# Patient Record
Sex: Male | Born: 1950 | Race: Black or African American | Hispanic: No | Marital: Married | Smoking: Never smoker
Health system: Southern US, Community
[De-identification: ages and names within clinical notes are randomized; demographics above are authoritative.]

## PROBLEM LIST (undated history)

## (undated) DIAGNOSIS — I10 Essential (primary) hypertension: Secondary | ICD-10-CM

## (undated) DIAGNOSIS — E079 Disorder of thyroid, unspecified: Secondary | ICD-10-CM

## (undated) HISTORY — DX: Essential (primary) hypertension: I10

## (undated) HISTORY — DX: Disorder of thyroid, unspecified: E07.9

---

## 1999-11-15 ENCOUNTER — Encounter: Payer: Self-pay | Admitting: Urology

## 1999-11-15 ENCOUNTER — Encounter: Admission: RE | Admit: 1999-11-15 | Discharge: 1999-11-15 | Payer: Self-pay | Admitting: Urology

## 1999-11-19 ENCOUNTER — Encounter (INDEPENDENT_AMBULATORY_CARE_PROVIDER_SITE_OTHER): Payer: Self-pay

## 1999-11-19 ENCOUNTER — Encounter: Payer: Self-pay | Admitting: Urology

## 1999-11-19 ENCOUNTER — Ambulatory Visit (HOSPITAL_COMMUNITY): Admission: RE | Admit: 1999-11-19 | Discharge: 1999-11-19 | Payer: Self-pay | Admitting: Urology

## 1999-11-20 ENCOUNTER — Encounter: Payer: Self-pay | Admitting: Urology

## 1999-11-20 ENCOUNTER — Inpatient Hospital Stay (HOSPITAL_COMMUNITY): Admission: EM | Admit: 1999-11-20 | Discharge: 1999-11-21 | Payer: Self-pay | Admitting: Urology

## 1999-11-21 ENCOUNTER — Encounter: Payer: Self-pay | Admitting: Gastroenterology

## 2002-11-19 ENCOUNTER — Encounter (INDEPENDENT_AMBULATORY_CARE_PROVIDER_SITE_OTHER): Payer: Self-pay | Admitting: *Deleted

## 2002-11-19 ENCOUNTER — Ambulatory Visit (HOSPITAL_COMMUNITY): Admission: RE | Admit: 2002-11-19 | Discharge: 2002-11-19 | Payer: Self-pay | Admitting: *Deleted

## 2004-04-05 ENCOUNTER — Encounter: Admission: RE | Admit: 2004-04-05 | Discharge: 2004-06-13 | Payer: Self-pay | Admitting: Occupational Therapy

## 2004-04-23 ENCOUNTER — Emergency Department (HOSPITAL_COMMUNITY): Admission: EM | Admit: 2004-04-23 | Discharge: 2004-04-23 | Payer: Self-pay

## 2005-10-24 ENCOUNTER — Emergency Department (HOSPITAL_COMMUNITY): Admission: EM | Admit: 2005-10-24 | Discharge: 2005-10-24 | Payer: Self-pay | Admitting: Emergency Medicine

## 2005-12-20 ENCOUNTER — Emergency Department (HOSPITAL_COMMUNITY): Admission: EM | Admit: 2005-12-20 | Discharge: 2005-12-20 | Payer: Self-pay | Admitting: Emergency Medicine

## 2006-06-27 ENCOUNTER — Emergency Department (HOSPITAL_COMMUNITY): Admission: EM | Admit: 2006-06-27 | Discharge: 2006-06-27 | Payer: Self-pay | Admitting: Emergency Medicine

## 2007-03-09 ENCOUNTER — Emergency Department (HOSPITAL_COMMUNITY): Admission: EM | Admit: 2007-03-09 | Discharge: 2007-03-09 | Payer: Self-pay | Admitting: Emergency Medicine

## 2007-10-31 ENCOUNTER — Emergency Department (HOSPITAL_COMMUNITY): Admission: EM | Admit: 2007-10-31 | Discharge: 2007-10-31 | Payer: Self-pay | Admitting: Emergency Medicine

## 2007-12-06 ENCOUNTER — Ambulatory Visit: Payer: Self-pay | Admitting: Internal Medicine

## 2007-12-06 ENCOUNTER — Observation Stay (HOSPITAL_COMMUNITY): Admission: EM | Admit: 2007-12-06 | Discharge: 2007-12-07 | Payer: Self-pay | Admitting: Emergency Medicine

## 2009-09-25 IMAGING — CT CT HEAD W/O CM
1 series · 16 of 30 positions shown, 20 images · IV contrast (agent unspecified)
Comparison: none

CLINICAL DATA: Syncopal episode.  
 HEAD CT WITHOUT CONTRAST:
TECHNIQUE: Contiguous axial images were obtained from the base of the skull through the vertex according to standard protocol without contrast.

[Series 2: head routine 4.8 h37s · axial · 0.47mm/px · z∈[-175,-20]mm · 16 of 36 slices shown, 20 images]
[im 2/36  brain]
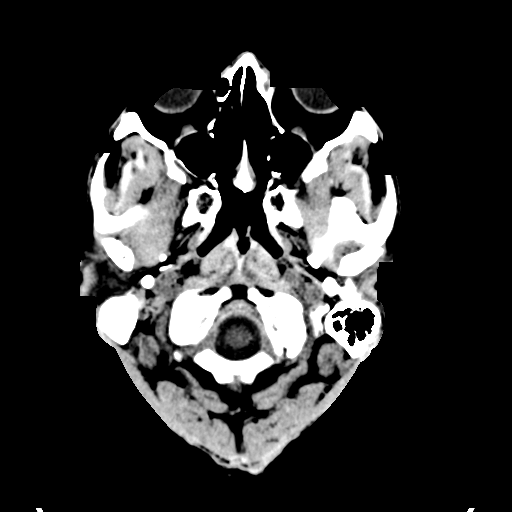
[im 2/36  bone]
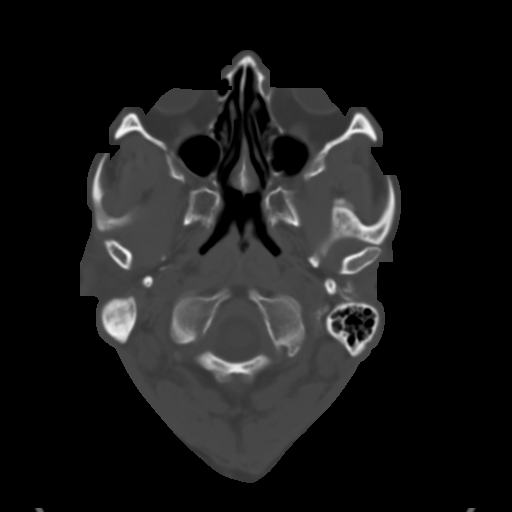
[im 4/36  brain]
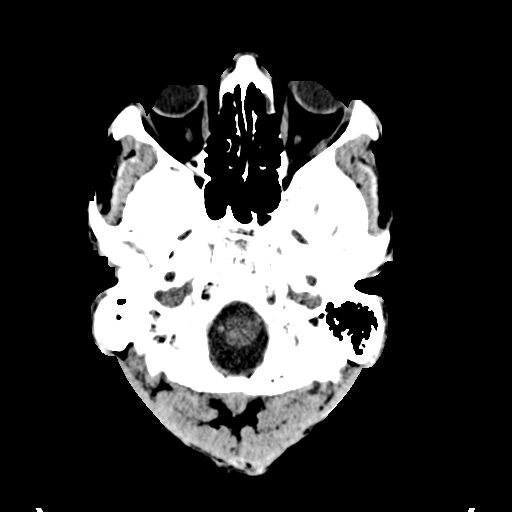
[im 7/36  brain]
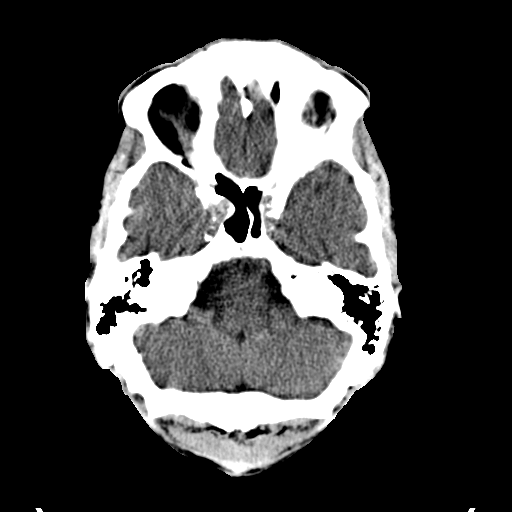
[im 9/36  brain]
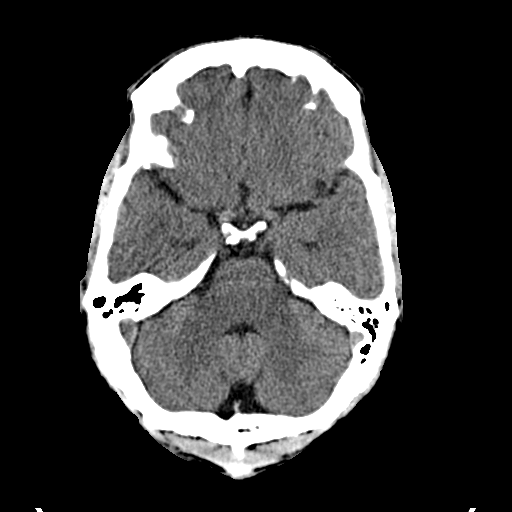
[im 10/36  brain]
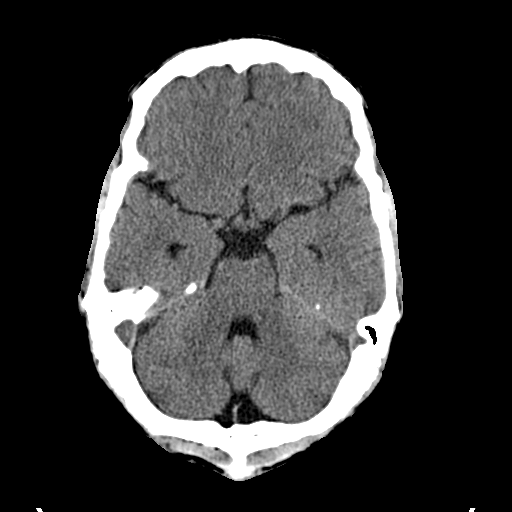
[im 10/36  bone]
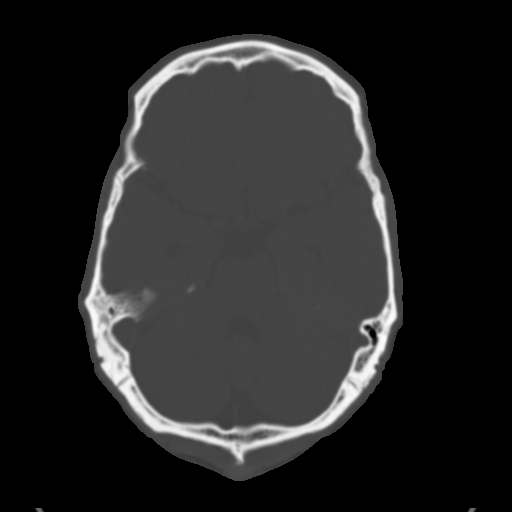
[im 13/36  brain]
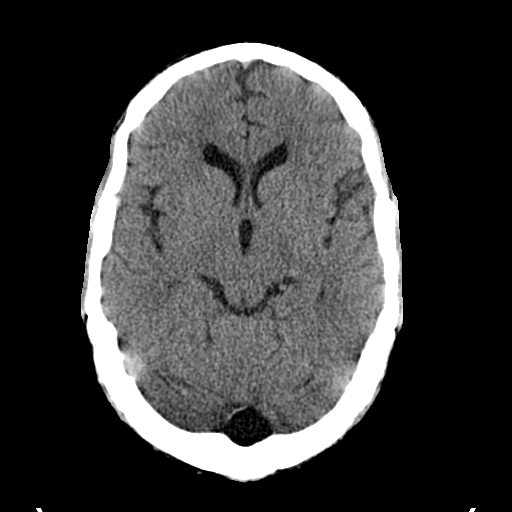
[im 15/36  brain]
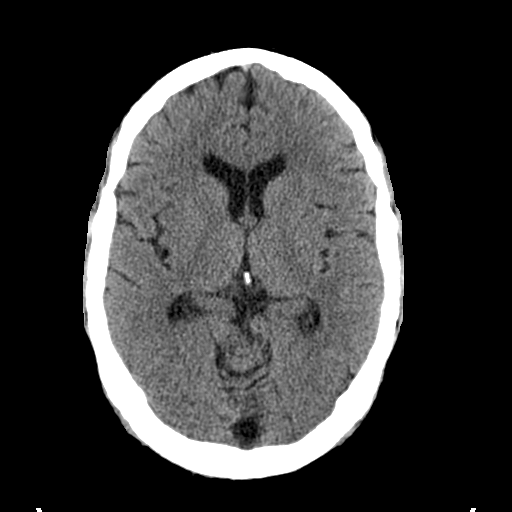
[im 17/36  brain]
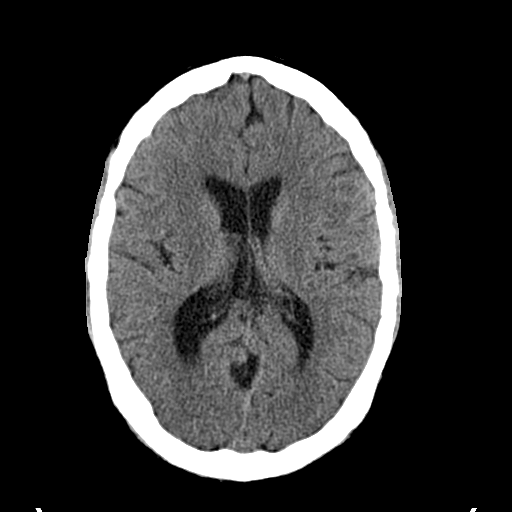
[im 19/36  brain]
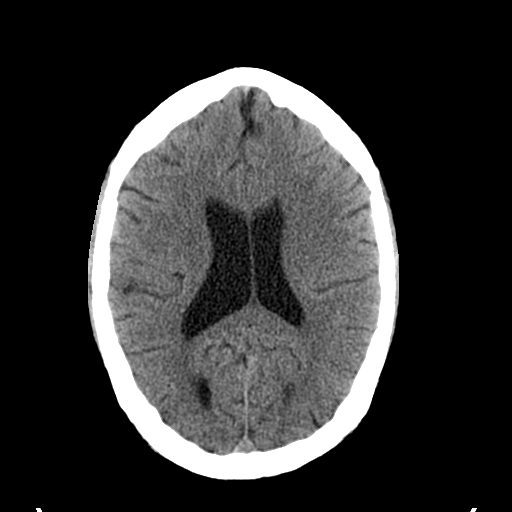
[im 19/36  bone]
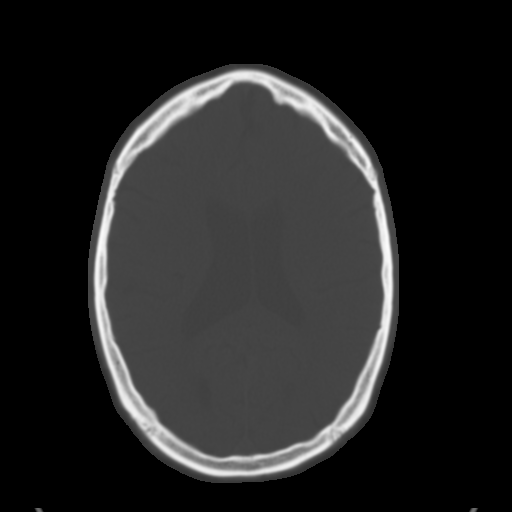
[im 21/36  brain]
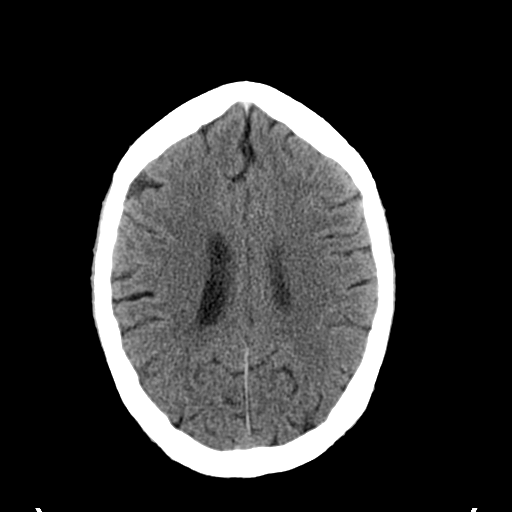
[im 23/36  brain]
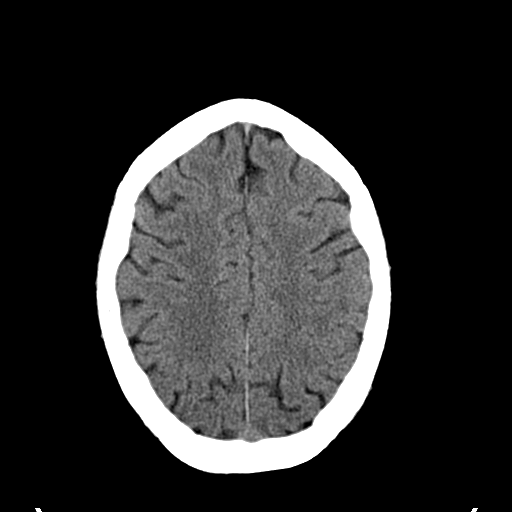
[im 26/36  brain]
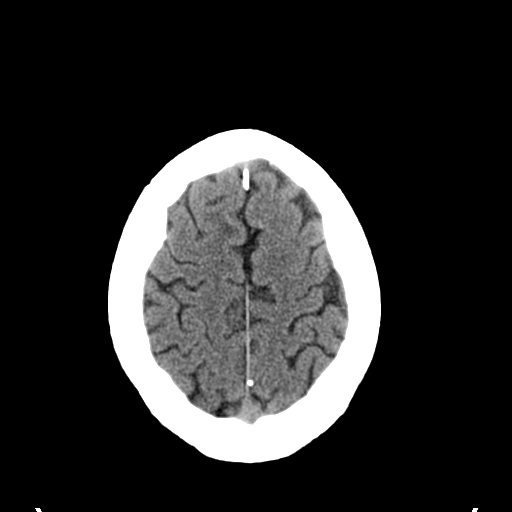
[im 27/36  brain]
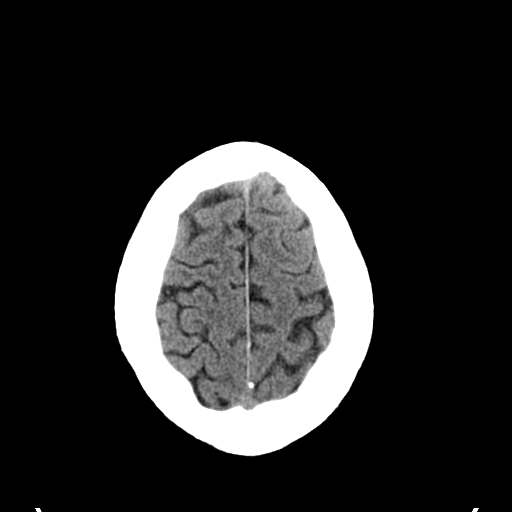
[im 27/36  bone]
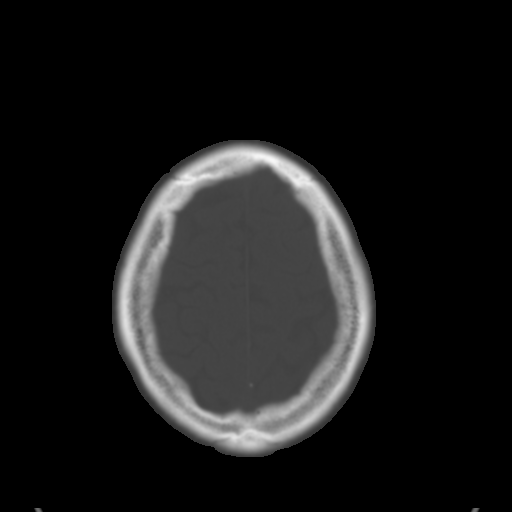
[im 29/36  brain]
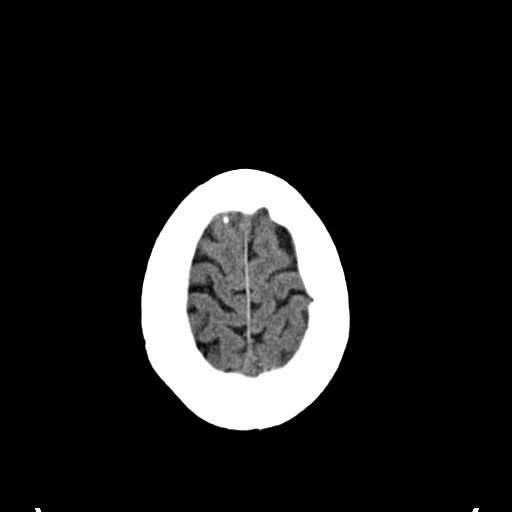
[im 32/36  brain]
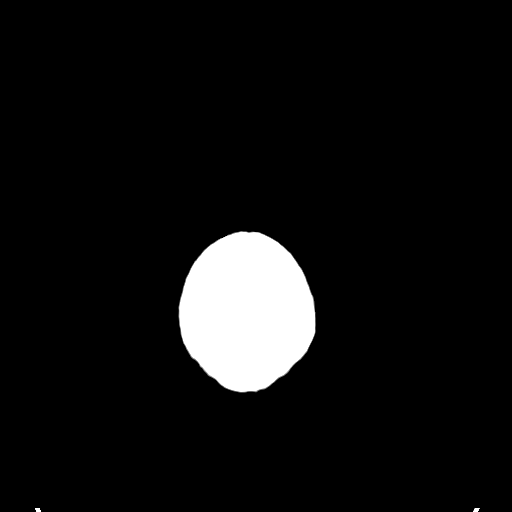
[im 34/36  brain]
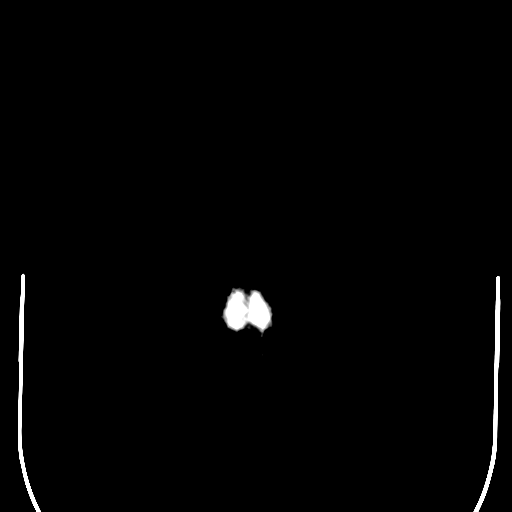

[16 of 30 positions shown; findings below may reference images not displayed]

FINDINGS: There is no evidence of intracranial hemorrhage, brain edema, acute infarct, mass lesion, or mass effect.  No other intra-axial abnormalities are seen, and the ventricles are within normal limits.  No abnormal extra-axial fluid collections or masses are identified.  No skull abnormalities are noted.
IMPRESSION: Negative non-contrast head CT.

## 2011-02-12 NOTE — Discharge Summary (Signed)
Lucas Davidson, HAWE NO.:  [id number]   MEDICAL RECORD NO.:  [id number]          PATIENT TYPE:  OBV   LOCATION:  6738                         FACILITY:  [facility]   PHYSICIAN:  Madaline Guthrie, M.D.    DATE OF BIRTH:  [birth year]   DATE OF ADMISSION:  12/06/2007  DATE OF DISCHARGE:  12/07/2007                               DISCHARGE SUMMARY   DISCHARGE DIAGNOSES:  1. Near syncope.  2. History of liver transplant.  3. End-stage renal disease.  4. Hypertension.  5. Hyperlipidemia.  6. Hypothyroidism.   DISCHARGE MEDICATIONS:  1. Metoprolol 25 mg p.o. b.i.d.  2. Synthroid 50 mcg p.o. daily.  3. Omeprazole.  4. Cellcept 250 mg two tablets p.o. b.i.d.  5. Pravastatin 40 mg p.o. daily.  6. Sirolimus (Rapamune) 1 mg two tablets p.o. daily.  7. Amlodipine 5 mg p.o. daily.  8. Aspirin 81 mg p.o. daily.  9. Fish Oil one capsule p.o. t.i.d.  10.Calcium 500 mg p.o. t.i.d.  11.Ferrous sulfate 325 mg p.o. t.i.d.   DISPOSITION:  The patient is being discharged on December 07, 2007.  The  patient states that he has an appointment this same morning at [facility] at 10 a.m. on the morning of December 07, 2007.  The patient is to follow up with Transplant Center and discuss  near syncopal episode.  The patient is to take discharge summary with  him for transplant center to review workup done in the hospital.   PROCEDURE:  The patient had a noncontrast CT of the head done December 06, 2007, preliminary impression negative, no acute findings.   CONSULTATIONS:  There were no consultations requested during this  hospitalization.   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  He  now presents with a  near syncopal episode the morning of admission while at church.  The  patient states they finished singing while at church in the choir.  The  patient sat down and felt queasy and began belching.  The patient  states belching has been present since liver transplant.  The patient  states that he had some feelings of warmth spread throughout his body  followed by diffuse sweating and pins and needle feeling in the head  area.  The patient had some associated nausea but no vomiting nor loss  of consciousness.  The patient was apparently taken by nurse for  examination.  The patient states that blood pressure was elevated.  EMS  was called at that time and the patient was brought to the emergency  department.  Glucose was not checked by EMS and was checked upon arrival  to the emergency department which was 94.  The patient's symptoms had  resolved by the time he arrived to the emergency department.  The  patient denies any fevers, chills,  weight loss, fatigue, recent travel,  sick contacts, night sweats, chest pain, palpitations, dyspnea,  orthopnea, syncope, dyspnea on exertion, cough, vomiting, diarrhea,  constipation, hematochezia, melena, dysuria, flank pain, numbness,  headache, diplopia, changes in vision, dizziness and balance vertigo, or  neck stiffness.   PHYSICAL EXAMINATION:  VITAL SIGNS:  Temperature 97.1, blood pressure  134/90, pulse 73, respirations 16, oxygen saturations 96% on room air.  GENERAL:  The patient was in no acute distress.  HEENT:  Eyes; no scleral icterus, pupils equal, round, and reactive to  light and accommodation, extraocular movements intact.  ENT pink moist  mucous membranes, oropharynx was clear.  NECK:  Supple with no carotid bruits.  LUNGS:  Respirations were clear to auscultation bilaterally with good  air movement.  CARDIOVASCULAR:  The patient had a regular rate and rhythm without any  murmurs, rubs, or gallops.   GASTROINTESTINAL:  Positive bowel sounds, soft, nontender, and  nondistended.  Scar from prior liver transplant noted with numbness  inferior to the scar noted.  EXTREMITIES:  Trace edema bilaterally.  GENITOURINARY:  No costovertebral angle tenderness.  SKIN:  No rashes noted, scar as per GI examination.  MUSCULOSKELETAL:  Chronic joint pain, but no swelling, erythema, or  tenderness upon palpation.  NEUROLOGY:  The patient was awake, alert and oriented x3.  Cranial  nerves II-XII grossly intact.  Strength intact.  Sensory intact.  No  asterixis.  PSYCHIATRIC:  The patient's affect was appropriate and pleasant.   LABORATORY DATA:  Sodium 142, potassium 4.2, chloride 110, bicarb 25,  BUN 32, creatinine 3.41, noted to be 3.37 in June of 2008, glucose 94,  anion gap 7, bilirubin 0.5, alkaline phosphatase 75, AST 24, ALT 26,  protein 6.6, albumin 3.5, calcium 9.1.  WBC 3.7, ANC 2.8, ALC 0.6,  hemoglobin 11.4 noted to be 11.1 in June of 2008, platelets 160,000.  Point of care markers included myoglobin 361, CK-MB 1.8, troponin I less  than 0.05.  Urinalysis only significant for trace blood and 30 of  protein.  PT was 13.3, INR 1.0, PTT 27.   HOSPITAL COURSE:  Problem 1.  Near syncope.  Differential diagnosis was  extensive but major concerns included liver failure/rejection which was  essentially ruled out with normal limit LFT's and coagulation panel.  Worsening renal failure was also of concern, but the patient's  creatinine was at baseline without any signs of uremia.  Stroke/bleed  was also of some concern but was essentially ruled out with a negative  CT scan of the head.  Anemia was also of concern, but the patient's  hemoglobin was 11.4 with a known history of anemia and prior hemoglobin  of 11.1 noted in June of 2008, the patient is also scheduled for Aranesp  injections every six weeks and is currently due for one at follow-up  appointment at [facility].  Acute coronary syndrome was  also considered, though  the patient does not have any history of coronary artery disease with an  apparent negative catheterization in 2006 and a negative stress test in  June of 2008 per patient.  Also first set of cardiac markers was only  positive for slightly increased myoglobin with a normal limit CK-MB and  troponin along with unchanged electrocardiogram except for flattening of  the P waves in the lateral leads.  The patient was essentially ruled out  for acute coronary syndrome with serial EKG's that were unchanged and  cardiac enzymes that were negative x3.  Arrhythmia was also considered  and the patient was observed on telemetry without any events noted.  Orthostatic hypotension was also considered, but given the patient was  not hypotensive nor orthostatic, blood pressure medications were  continued.  Hypoglycemia was also entertained, but given that CBG was  not measured on the field, it is unclear if that is a possible etiology,  though upon arrival at the emergency department, glucose was 94 and the  patient has no history of diabetes nor is on antihyperglycemics.  The  patient does have a history of hypothyroidism, therefore TSH was checked  given the patient is on Synthroid.  The patient's TSH was within normal  limits at 3.4.   Problem 2.  History of liver transplant.  As per #1 the patient had a  second liver transplant on November 29, 2003, approximately one month after  the first secondary to rejection.  The patient's LFT's and coags were  within normal limits.  The patient is followed up at [facility] and has an  appointment on December 07, 2007.  The patient was continued on Cellcept and  Rapamune during the hospitalization.   Problem 3.  End-stage renal disease.  The patient is followed at [facility] by  Dr. Tammi Klippel.  Creatinine is at baseline of 3.5.  The patient did not  have any signs of uremia during the hospitalization.   Problem 4.  Hypertension.  The patient's blood pressure  was stable and  blood pressure medications were continued.   Problem 5.  Hyperlipidemia.  The patient's Pravastatin was continued.   Problem 6.  History of hypothyroidism.  The patient's Synthroid was  continued and TSH was checked and was within normal limits at 3.4.   DISCHARGE LABORATORY DATA:  PSH 3.4, CK 226, CK-MB 2.0.  Troponin 0.06.  PTT 27, PT 13.3, INR 1.0.      Mariea Stable, MD  Electronically Signed      Madaline Guthrie, M.D.  Electronically Signed    MA/MEDQ  D:  12/06/2007  T:  12/07/2007  Job:  [id number]   cc:   [facility]  Dr. Tammi Klippel, [facility]

## 2011-06-24 LAB — DIFFERENTIAL
Basophils Relative: 0
Eosinophils Absolute: 0
Monocytes Absolute: 0.3
Monocytes Relative: 8

## 2011-06-24 LAB — URINALYSIS, ROUTINE W REFLEX MICROSCOPIC
Bilirubin Urine: NEGATIVE
Ketones, ur: NEGATIVE
Nitrite: NEGATIVE
Specific Gravity, Urine: 1.012
Urobilinogen, UA: 0.2

## 2011-06-24 LAB — CARDIAC PANEL(CRET KIN+CKTOT+MB+TROPI)
CK, MB: 1.5
Relative Index: 0.7
Total CK: 202

## 2011-06-24 LAB — BASIC METABOLIC PANEL
CO2: 25
GFR calc non Af Amer: 19 — ABNORMAL LOW
Glucose, Bld: 106 — ABNORMAL HIGH
Potassium: 3.9
Sodium: 139

## 2011-06-24 LAB — COMPREHENSIVE METABOLIC PANEL
ALT: 26
Alkaline Phosphatase: 75
BUN: 32 — ABNORMAL HIGH
CO2: 25
GFR calc non Af Amer: 19 — ABNORMAL LOW
Glucose, Bld: 94
Potassium: 4.2
Sodium: 142

## 2011-06-24 LAB — CBC
HCT: 34.6 — ABNORMAL LOW
Hemoglobin: 11.4 — ABNORMAL LOW
MCHC: 32.9
RBC: 4.49
RDW: 15.6 — ABNORMAL HIGH

## 2011-06-24 LAB — CK TOTAL AND CKMB (NOT AT ARMC)
CK, MB: 1.8
CK, MB: 2
Relative Index: 0.9

## 2011-06-24 LAB — URINE MICROSCOPIC-ADD ON

## 2011-06-24 LAB — POCT CARDIAC MARKERS
CKMB, poc: 1.8
Troponin i, poc: <0.05

## 2011-07-18 LAB — CBC
HCT: 34 — ABNORMAL LOW
Hemoglobin: 11.1 — ABNORMAL LOW
MCHC: 32.8
MCV: 77.8 — ABNORMAL LOW
Platelets: 182
RDW: 16.2 — ABNORMAL HIGH

## 2011-07-18 LAB — URINE MICROSCOPIC-ADD ON

## 2011-07-18 LAB — BASIC METABOLIC PANEL
BUN: 39 — ABNORMAL HIGH
CO2: 22
Chloride: 112
GFR calc non Af Amer: 19 — ABNORMAL LOW
Glucose, Bld: 88
Potassium: 3.8

## 2011-07-18 LAB — DIFFERENTIAL
Basophils Absolute: 0
Basophils Relative: 1
Eosinophils Absolute: 0
Eosinophils Relative: 1
Monocytes Absolute: 0.4

## 2011-07-18 LAB — URINALYSIS, ROUTINE W REFLEX MICROSCOPIC
Bilirubin Urine: NEGATIVE
Nitrite: NEGATIVE
Protein, ur: 30 — AB
Specific Gravity, Urine: 1.016
Urobilinogen, UA: 0.2

## 2018-05-31 HISTORY — PX: CATARACT EXTRACTION: SUR2

## 2020-02-07 ENCOUNTER — Ambulatory Visit (INDEPENDENT_AMBULATORY_CARE_PROVIDER_SITE_OTHER): Payer: Medicare Other | Admitting: Ophthalmology

## 2020-02-07 ENCOUNTER — Other Ambulatory Visit: Payer: Self-pay

## 2020-02-07 ENCOUNTER — Encounter (INDEPENDENT_AMBULATORY_CARE_PROVIDER_SITE_OTHER): Payer: Self-pay | Admitting: Ophthalmology

## 2020-02-07 DIAGNOSIS — H35372 Puckering of macula, left eye: Secondary | ICD-10-CM | POA: Diagnosis not present

## 2020-02-07 DIAGNOSIS — H348122 Central retinal vein occlusion, left eye, stable: Secondary | ICD-10-CM

## 2020-02-07 DIAGNOSIS — H35351 Cystoid macular degeneration, right eye: Secondary | ICD-10-CM | POA: Insufficient documentation

## 2020-02-07 NOTE — Assessment & Plan Note (Signed)
The nature of central retinal vein occlusion was discussed with the patient including the division of types into nonischemic ischemic. The potential sequelae of ischemic central retinal vein occlusion, including macular edema, neovascularization, rubeosis iridis, and neovascular glaucoma, were discussed, and the need for frequent follow-up.  The nature of macular edema and central retinal vein occlusion was discussed. The following options were considered:  1.Observation for a period to look for spontaneous improvement, is no linger the primary therapy. One-third worsen, one-third stay unchanged, and one-third improves.  2. Anti-VEGF Therapy. ( Lucentis, Avastin or Eylea ) injected  in intravitreal fashion, initially monthly then tailored to clinical response.  3. Intravitreal steroid usage, Kenalog, or Ozurdex, usually a second line therapy or in combination with anti-Vegf therapy noted above.  4. Panretinal laser photocoagulation to cause regression of iris neovascularization, or treat retinal  non-perfusion.  5. Surgical Management may include vitrectomy with incisions of peripheral veins to trigger retino choroidal anastomosis formation. This topic presented and discussed at [facility] 2011.  

## 2020-02-07 NOTE — Progress Notes (Signed)
02/07/2020     CHIEF COMPLAINT Patient presents for Retina Follow Up   HISTORY OF PRESENT ILLNESS: Lucas Davidson is a 69 y.o. male who presents to the clinic today for:   HPI    Retina Follow Up    Patient presents with  Other.  In both eyes.  Duration of 1 year.  Since onset it is stable.          Comments    1 year follow up - OCT OU Patient denies change in vision and overall has no complaints.        Last edited by Berenice Bouton on 02/07/2020 12:59 PM. (History)      Referring physician: [name], MD [address] [place],  [place] [address]  HISTORICAL INFORMATION:   Selected notes from the MEDICAL RECORD NUMBER       CURRENT MEDICATIONS: No current outpatient medications on file. (Ophthalmic Drugs)   No current facility-administered medications for this visit. (Ophthalmic Drugs)   Current Outpatient Medications (Other)  Medication Sig  . aspirin EC 81 MG tablet Take 81 mg by mouth daily.  Marland Kitchen lisinopril (ZESTRIL) 10 MG tablet Take 10 mg by mouth daily.  . metoprolol tartrate (LOPRESSOR) 50 MG tablet Take 50 mg by mouth 2 (two) times daily.  . mycophenolate (CELLCEPT) 500 MG tablet Take by mouth 2 (two) times daily.  . Vitamin D, Ergocalciferol, (DRISDOL) 1.25 MG (50000 UNIT) CAPS capsule Take 50,000 Units by mouth every 7 (seven) days.   No current facility-administered medications for this visit. (Other)      REVIEW OF SYSTEMS:    ALLERGIES Allergies  Allergen Reactions  . Lipitor [Atorvastatin]   . Penicillins     PAST MEDICAL HISTORY Past Medical History:  Diagnosis Date  . Hypertension   . Thyroid disease    Past Surgical History:  Procedure Laterality Date  . CATARACT EXTRACTION Left 05/31/2018    FAMILY HISTORY Family History  Problem Relation Age of Onset  . Lupus Mother     SOCIAL HISTORY Social History   Tobacco Use  . Smoking status: Never Smoker  . Smokeless tobacco: Never Used  Substance Use Topics  .  Alcohol use: Not on file  . Drug use: Not on file         OPHTHALMIC EXAM: Base Eye Exam    Visual Acuity (Snellen - Linear)      Right Left   Dist [place] 20/25+2 20/400   Dist ph [place]  20/200+1       Tonometry (Tonopen, 1:07 PM)      Right Left   Pressure 11 11       Pupils      Pupils Dark Light Shape React APD   Right PERRL 4 3 Round Slow None   Left PERRL 4 3 Round Slow None       Visual Fields (Counting fingers)      Left Right    Full Full       Extraocular Movement      Right Left    Full Full       Neuro/Psych    Oriented x3: Yes   Mood/Affect: Normal       Dilation    Both eyes: 1.0% Mydriacyl, 2.5% Phenylephrine @ 1:07 PM        Slit Lamp and Fundus Exam    External Exam      Right Left   External Normal Normal  Slit Lamp Exam      Right Left   Lids/Lashes Normal Normal   Conjunctiva/Sclera White and quiet White and quiet   Cornea Clear Clear   Anterior Chamber Deep and quiet Deep and quiet   Iris Round and reactive Round and reactive   Lens 2+ Nuclear sclerosis Posterior chamber intraocular lens   Anterior Vitreous Normal Normal       Fundus Exam      Right Left   Posterior Vitreous Normal Normal   Disc Normal Normal   C/D Ratio 0.2 0.4   Macula Normal Microaneurysms   Vessels Normal old CRVO , inactive did vessels, sclerotic arteries   Periphery Normal Good PRP          IMAGING AND PROCEDURES  Imaging and Procedures for 02/07/20  OCT, Retina - OU - Both Eyes       Right Eye Quality was good. Scan locations included subfoveal. Central Foveal Thickness: 284. Progression has been stable. Findings include normal observations.   Left Eye Quality was good. Scan locations included subfoveal. Central Foveal Thickness: 233. Progression has been stable. Findings include abnormal foveal contour, outer retinal atrophy, central retinal atrophy, inner retinal atrophy.   Notes Diffuse retinal atrophy in the macular region of the  left eye due to previous retinal vascular disease now quiescent                ASSESSMENT/PLAN:  Central retinal vein occlusion of left eye The nature of central retinal vein occlusion was discussed with the patient including the division of types into nonischemic ischemic. The potential sequelae of ischemic central retinal vein occlusion, including macular edema, neovascularization, rubeosis iridis, and neovascular glaucoma, were discussed, and the need for frequent follow-up.  The nature of macular edema and central retinal vein occlusion was discussed. The following options were considered:  1.Observation for a period to look for spontaneous improvement, is no linger the primary therapy. One-third worsen, one-third stay unchanged, and one-third improves.  2. Anti-VEGF Therapy. ( Lucentis, Avastin or Eylea ) injected  in intravitreal fashion, initially monthly then tailored to clinical response.  3. Intravitreal steroid usage, Kenalog, or Ozurdex, usually a second line therapy or in combination with anti-Vegf therapy noted above.  4. Panretinal laser photocoagulation to cause regression of iris neovascularization, or treat retinal  non-perfusion.  5. Surgical Management may include vitrectomy with incisions of peripheral veins to trigger retino choroidal anastomosis formation. This topic presented and discussed at [place].       ICD-10-CM   1. Central retinal vein occlusion of left eye, unspecified complication status  M19.6222 OCT, Retina - OU - Both Eyes  2. Cystoid macular edema of right eye  H35.351 OCT, Retina - OU - Both Eyes  3. Left epiretinal membrane  H35.372 OCT, Retina - OU - Both Eyes    1.  OS, stable over many years for now I stabilize central retinal vein occlusion with no active macular edema will observe  2.  3.  Ophthalmic Meds Ordered this visit:  No orders of the defined types were placed in this encounter.      Return in about 9 months (around  11/09/2020) for DILATE OU, COLOR FP.  Patient Instructions  Central Retinal Vein Occlusion, Adult  Central retinal vein occlusion (CRVO) is a blockage in the main blood vessel that carries blood away from the retina (central retinal vein). The retina is the part of your eye that senses light and sends signals to the brain  that allow you to see. CRVO can cause blurry vision. It can also cause complete or partial vision loss. The condition usually affects only one eye. What are the causes? This condition is usually caused by a blood clot that forms inside the central retinal vein. A clot is blood that has thickened into a gel or solid. Vision loss happens because the blockage in the vein causes fluid to leak out of the vein and collect in the retina. Sometimes the cause is not known. What increases the risk? This condition is more likely to develop in:  People who have a blood vessel disease that causes narrowing of the arteries (atherosclerosis). This is the main risk factor for this condition.  People who are age 34 or older.  People who have any of the following medical conditions: ? High blood pressure (hypertension). ? Diabetes. ? Heart disease. ? High levels of fat in the blood (hyperlipidemia). ? Increased pressure inside the eye (glaucoma). ? Disorders that increase blood clotting (coagulopathy). What are the signs or symptoms? Symptoms of this condition include:  Sudden and painless vision loss. If the vision loss is partial, it may get worse over a span of hours or days.  Sudden and painless blurry vision.  Tiny spots or clumps that move across your vision (floaters).  Pain or pressure in your eye (uncommon). How is this diagnosed? This condition is usually diagnosed by a health care provider who specializes in eye diseases (ophthalmologist). To diagnose the condition, the ophthalmologist will do an eye exam. She or he may also:  Do an exam that involves having eye drops  put in the eye (slit lamp exam or dilated fundus exam).  Order tests that help diagnose the condition, such as: ? A vision test. ? A measurement of the pressure inside your eye. ? A fluorescein angiogram. In this test, pictures are taken of your retina after a dye is injected into your bloodstream. ? Optical coherence tomography. In this test, light waves are used to create pictures of your retina.  Check your blood pressure.  Order tests to find the cause of your condition and rule out other conditions. The tests may check for these problems: ? Diabetes. ? High levels of fat or cholesterol in your blood. ? Abnormal blood clotting. How is this treated? There is no cure for this condition. The goals of treatment are to save as much of your vision as possible and to prevent the condition from happening again. Treatment may include:  Medicine to reduce swelling in your eye. The medicine may be injected in your eye, or an implant may be placed in your eye to release the medicine.  Laser treatment to reduce swelling and stop fluid leakage in the eye (focal laser treatment).  Laser treatment to seal or destroy abnormal blood vessels (pan-retinal laser treatment). Follow these instructions at home:  Use over-the-counter and prescription medicines only as told by your health care provider.  Keep all follow-up visits as told by your health care provider. This is important. How is this prevented? To help prevent this condition:  Work with your health care providers to reduce your risk factors.  Do not use any products that contain nicotine or tobacco, such as cigarettes and e-cigarettes. If you need help quitting, ask your health care provider.  Follow a heart-healthy diet. This diet includes a lot of fruits, vegetables, grains, and lean protein. It is low in saturated fat and sugar.  Maintain a healthy weight.  Exercise  regularly. Contact a health care provider if:  You have any  changes in your vision. Get help right away if:  You have eye pain.  You have symptoms of this condition, including blurry vision or floaters.  You have new or sudden vision loss. Summary  Central retinal vein occlusion (CRVO) is a blockage in the main blood vessel that carries blood away from the retina (central retinal vein). The retina is the part of your eye that senses light and sends signals to the brain that allow you to see.  CRVO can cause blurry vision. It can also cause complete or partial vision loss. The condition usually affects only one eye.  There is no cure for this condition. The goals of treatment are to save as much of your vision as possible and to prevent the condition from happening again.  Get help right away if you have new or sudden vision loss. This information is not intended to replace advice given to you by your health care provider. Make sure you discuss any questions you have with your health care provider. Document Revised: 08/29/2017 Document Reviewed: 11/22/2016 Elsevier Patient Education  2020 [employer].     Explained the diagnoses, plan, and follow up with the patient and they expressed understanding.  Patient expressed understanding of the importance of proper follow up care.   Alford Highland Keiasha Diep M.D. Diseases & Surgery of the Retina and Vitreous [facility] 02/07/20     Abbreviations: M myopia (nearsighted); A astigmatism; H hyperopia (farsighted); P presbyopia; Mrx spectacle prescription;  CTL contact lenses; OD right eye; OS left eye; OU both eyes  XT exotropia; ET esotropia; PEK punctate epithelial keratitis; PEE punctate epithelial erosions; DES dry eye syndrome; MGD meibomian gland dysfunction; ATs artificial tears; PFAT's preservative free artificial tears; NSC nuclear sclerotic cataract; PSC posterior subcapsular cataract; ERM epi-retinal membrane; PVD posterior vitreous detachment; RD retinal detachment; DM diabetes  mellitus; DR diabetic retinopathy; NPDR non-proliferative diabetic retinopathy; PDR proliferative diabetic retinopathy; CSME clinically significant macular edema; DME diabetic macular edema; dbh dot blot hemorrhages; CWS cotton wool spot; POAG primary open angle glaucoma; C/D cup-to-disc ratio; HVF humphrey visual field; GVF goldmann visual field; OCT optical coherence tomography; IOP intraocular pressure; BRVO Branch retinal vein occlusion; CRVO central retinal vein occlusion; CRAO central retinal artery occlusion; BRAO branch retinal artery occlusion; RT retinal tear; SB scleral buckle; PPV pars plana vitrectomy; VH Vitreous hemorrhage; PRP panretinal laser photocoagulation; IVK intravitreal kenalog; VMT vitreomacular traction; MH Macular hole;  NVD neovascularization of the disc; NVE neovascularization elsewhere; AREDS age related eye disease study; ARMD age related macular degeneration; POAG primary open angle glaucoma; EBMD epithelial/anterior basement membrane dystrophy; ACIOL anterior chamber intraocular lens; IOL intraocular lens; PCIOL posterior chamber intraocular lens; Phaco/IOL phacoemulsification with intraocular lens placement; PRK photorefractive keratectomy; LASIK laser assisted in situ keratomileusis; HTN hypertension; DM diabetes mellitus; COPD chronic obstructive pulmonary disease

## 2020-02-07 NOTE — Patient Instructions (Signed)
Central Retinal Vein Occlusion, Adult  Central retinal vein occlusion (CRVO) is a blockage in the main blood vessel that carries blood away from the retina (central retinal vein). The retina is the part of your eye that senses light and sends signals to the brain that allow you to see. CRVO can cause blurry vision. It can also cause complete or partial vision loss. The condition usually affects only one eye. What are the causes? This condition is usually caused by a blood clot that forms inside the central retinal vein. A clot is blood that has thickened into a gel or solid. Vision loss happens because the blockage in the vein causes fluid to leak out of the vein and collect in the retina. Sometimes the cause is not known. What increases the risk? This condition is more likely to develop in:  People who have a blood vessel disease that causes narrowing of the arteries (atherosclerosis). This is the main risk factor for this condition.  People who are age 50 or older.  People who have any of the following medical conditions: ? High blood pressure (hypertension). ? Diabetes. ? Heart disease. ? High levels of fat in the blood (hyperlipidemia). ? Increased pressure inside the eye (glaucoma). ? Disorders that increase blood clotting (coagulopathy). What are the signs or symptoms? Symptoms of this condition include:  Sudden and painless vision loss. If the vision loss is partial, it may get worse over a span of hours or days.  Sudden and painless blurry vision.  Tiny spots or clumps that move across your vision (floaters).  Pain or pressure in your eye (uncommon). How is this diagnosed? This condition is usually diagnosed by a health care provider who specializes in eye diseases (ophthalmologist). To diagnose the condition, the ophthalmologist will do an eye exam. She or he may also:  Do an exam that involves having eye drops put in the eye (slit lamp exam or dilated fundus exam).  Order  tests that help diagnose the condition, such as: ? A vision test. ? A measurement of the pressure inside your eye. ? A fluorescein angiogram. In this test, pictures are taken of your retina after a dye is injected into your bloodstream. ? Optical coherence tomography. In this test, light waves are used to create pictures of your retina.  Check your blood pressure.  Order tests to find the cause of your condition and rule out other conditions. The tests may check for these problems: ? Diabetes. ? High levels of fat or cholesterol in your blood. ? Abnormal blood clotting. How is this treated? There is no cure for this condition. The goals of treatment are to save as much of your vision as possible and to prevent the condition from happening again. Treatment may include:  Medicine to reduce swelling in your eye. The medicine may be injected in your eye, or an implant may be placed in your eye to release the medicine.  Laser treatment to reduce swelling and stop fluid leakage in the eye (focal laser treatment).  Laser treatment to seal or destroy abnormal blood vessels (pan-retinal laser treatment). Follow these instructions at home:  Use over-the-counter and prescription medicines only as told by your health care provider.  Keep all follow-up visits as told by your health care provider. This is important. How is this prevented? To help prevent this condition:  Work with your health care providers to reduce your risk factors.  Do not use any products that contain nicotine or tobacco, such   as cigarettes and e-cigarettes. If you need help quitting, ask your health care provider.  Follow a heart-healthy diet. This diet includes a lot of fruits, vegetables, grains, and lean protein. It is low in saturated fat and sugar.  Maintain a healthy weight.  Exercise regularly. Contact a health care provider if:  You have any changes in your vision. Get help right away if:  You have eye  pain.  You have symptoms of this condition, including blurry vision or floaters.  You have new or sudden vision loss. Summary  Central retinal vein occlusion (CRVO) is a blockage in the main blood vessel that carries blood away from the retina (central retinal vein). The retina is the part of your eye that senses light and sends signals to the brain that allow you to see.  CRVO can cause blurry vision. It can also cause complete or partial vision loss. The condition usually affects only one eye.  There is no cure for this condition. The goals of treatment are to save as much of your vision as possible and to prevent the condition from happening again.  Get help right away if you have new or sudden vision loss. This information is not intended to replace advice given to you by your health care provider. Make sure you discuss any questions you have with your health care provider. Document Revised: 08/29/2017 Document Reviewed: 11/22/2016 Elsevier Patient Education  2020 [employer]  

## 2020-11-09 ENCOUNTER — Encounter (INDEPENDENT_AMBULATORY_CARE_PROVIDER_SITE_OTHER): Payer: Medicare Other | Admitting: Ophthalmology

## 2020-11-13 ENCOUNTER — Other Ambulatory Visit: Payer: Self-pay

## 2020-11-13 ENCOUNTER — Ambulatory Visit (INDEPENDENT_AMBULATORY_CARE_PROVIDER_SITE_OTHER): Payer: Medicare Other | Admitting: Ophthalmology

## 2020-11-13 ENCOUNTER — Encounter (INDEPENDENT_AMBULATORY_CARE_PROVIDER_SITE_OTHER): Payer: Self-pay | Admitting: Ophthalmology

## 2020-11-13 DIAGNOSIS — H348122 Central retinal vein occlusion, left eye, stable: Secondary | ICD-10-CM | POA: Diagnosis not present

## 2020-11-13 DIAGNOSIS — H2511 Age-related nuclear cataract, right eye: Secondary | ICD-10-CM

## 2020-11-13 NOTE — Assessment & Plan Note (Signed)
No active disease OS, will continue to monitor for neovascular complications

## 2020-11-13 NOTE — Assessment & Plan Note (Signed)
Minor cataract opacity and some color change no impact on acuity observe

## 2020-11-13 NOTE — Progress Notes (Signed)
11/13/2020     CHIEF COMPLAINT Patient presents for Retina Follow Up (9 Month F/U OU//Pt denies noticeable changes to [place] OU since last visit. Pt denies ocular pain, flashes of light, or floaters OU. //)   HISTORY OF PRESENT ILLNESS: Lucas Davidson is a 70 y.o. male who presents to the clinic today for:   HPI    Retina Follow Up    Patient presents with  CRVO/BRVO.  In left eye.  This started 9 months ago.  Severity is moderate.  Duration of 9 months.  Since onset it is stable. Additional comments: 9 Month F/U OU  Pt denies noticeable changes to [place] OU since last visit. Pt denies ocular pain, flashes of light, or floaters OU.          Last edited by Ileana Roup, COA on 11/13/2020  1:22 PM. (History)      Referring physician: [name], MD [address] [place],  [place] [address]  HISTORICAL INFORMATION:   Selected notes from the MEDICAL RECORD NUMBER       CURRENT MEDICATIONS: No current outpatient medications on file. (Ophthalmic Drugs)   No current facility-administered medications for this visit. (Ophthalmic Drugs)   Current Outpatient Medications (Other)  Medication Sig  . aspirin EC 81 MG tablet Take 81 mg by mouth daily.  Marland Kitchen lisinopril (ZESTRIL) 10 MG tablet Take 10 mg by mouth daily.  . metoprolol tartrate (LOPRESSOR) 50 MG tablet Take 50 mg by mouth 2 (two) times daily.  . mycophenolate (CELLCEPT) 500 MG tablet Take by mouth 2 (two) times daily.  . Vitamin D, Ergocalciferol, (DRISDOL) 1.25 MG (50000 UNIT) CAPS capsule Take 50,000 Units by mouth every 7 (seven) days.   No current facility-administered medications for this visit. (Other)      REVIEW OF SYSTEMS:    ALLERGIES Allergies  Allergen Reactions  . Lipitor [Atorvastatin]   . Penicillins     PAST MEDICAL HISTORY Past Medical History:  Diagnosis Date  . Hypertension   . Thyroid disease    Past Surgical History:  Procedure Laterality Date  . CATARACT EXTRACTION Left  05/31/2018    FAMILY HISTORY Family History  Problem Relation Age of Onset  . Lupus Mother     SOCIAL HISTORY Social History   Tobacco Use  . Smoking status: Never Smoker  . Smokeless tobacco: Never Used         OPHTHALMIC EXAM:  Base Eye Exam    Visual Acuity (ETDRS)      Right Left   Dist [place] 20/20 20/400   Dist ph [place]  20/200       Tonometry (Tonopen, 1:22 PM)      Right Left   Pressure 12 13       Pupils      Pupils Dark Light Shape React APD   Right PERRL 5 4 Round Brisk None   Left PERRL 5 4 Round Brisk None       Visual Fields (Counting fingers)      Left Right    Full Full       Extraocular Movement      Right Left    Full Full       Neuro/Psych    Oriented x3: Yes   Mood/Affect: Normal       Dilation    Both eyes: 1.0% Mydriacyl, 2.5% Phenylephrine @ 1:25 PM        Slit Lamp and Fundus Exam  External Exam      Right Left   External Normal Normal       Slit Lamp Exam      Right Left   Lids/Lashes Normal Normal   Conjunctiva/Sclera White and quiet White and quiet   Cornea Clear Clear   Anterior Chamber Deep and quiet Deep and quiet   Iris Round and reactive Round and reactive   Lens 2+ Nuclear sclerosis Posterior chamber intraocular lens   Anterior Vitreous Normal Normal       Fundus Exam      Right Left   Posterior Vitreous Normal Normal   Disc Normal Normal   C/D Ratio 0.2 0.4   Macula Normal Microaneurysms   Vessels Normal old CRVO , inactive did vessels, sclerotic arteries   Periphery Normal Good PRP          IMAGING AND PROCEDURES  Imaging and Procedures for 11/13/20  Color Fundus Photography Optos - OU - Both Eyes       Right Eye Disc findings include normal observations.   Left Eye Progression has been stable. Disc findings include normal observations.   Notes Old ischemic central retinal vein occlusion left eye, quiescent disease, no active CME, good PRP peripherally.  No signs of  neovascularization                ASSESSMENT/PLAN:  Central retinal vein occlusion, left eye, stable No active disease OS, will continue to monitor for neovascular complications  Age-related nuclear cataract of right eye Minor cataract opacity and some color change no impact on acuity observe      ICD-10-CM   1. Central retinal vein occlusion of left eye, unspecified complication status  H34.8122 Color Fundus Photography Optos - OU - Both Eyes  2. Central retinal vein occlusion, left eye, stable  H34.8122   3. Age-related nuclear cataract of right eye  H25.11     1.  No active disease OU we will continue to monitor.  Patient to contact the office promptly should new visual symptoms arise  2.  3.  Ophthalmic Meds Ordered this visit:  No orders of the defined types were placed in this encounter.      Return in about 9 months (around 08/13/2021) for DILATE OU, OCT.  There are no Patient Instructions on file for this visit.   Explained the diagnoses, plan, and follow up with the patient and they expressed understanding.  Patient expressed understanding of the importance of proper follow up care.   Alford Highland Saraiya Kozma M.D. Diseases & Surgery of the Retina and Vitreous [facility] 11/13/20     Abbreviations: M myopia (nearsighted); A astigmatism; H hyperopia (farsighted); P presbyopia; Mrx spectacle prescription;  CTL contact lenses; OD right eye; OS left eye; OU both eyes  XT exotropia; ET esotropia; PEK punctate epithelial keratitis; PEE punctate epithelial erosions; DES dry eye syndrome; MGD meibomian gland dysfunction; ATs artificial tears; PFAT's preservative free artificial tears; NSC nuclear sclerotic cataract; PSC posterior subcapsular cataract; ERM epi-retinal membrane; PVD posterior vitreous detachment; RD retinal detachment; DM diabetes mellitus; DR diabetic retinopathy; NPDR non-proliferative diabetic retinopathy; PDR proliferative diabetic  retinopathy; CSME clinically significant macular edema; DME diabetic macular edema; dbh dot blot hemorrhages; CWS cotton wool spot; POAG primary open angle glaucoma; C/D cup-to-disc ratio; HVF humphrey visual field; GVF goldmann visual field; OCT optical coherence tomography; IOP intraocular pressure; BRVO Branch retinal vein occlusion; CRVO central retinal vein occlusion; CRAO central retinal artery occlusion; BRAO branch retinal artery  occlusion; RT retinal tear; SB scleral buckle; PPV pars plana vitrectomy; VH Vitreous hemorrhage; PRP panretinal laser photocoagulation; IVK intravitreal kenalog; VMT vitreomacular traction; MH Macular hole;  NVD neovascularization of the disc; NVE neovascularization elsewhere; AREDS age related eye disease study; ARMD age related macular degeneration; POAG primary open angle glaucoma; EBMD epithelial/anterior basement membrane dystrophy; ACIOL anterior chamber intraocular lens; IOL intraocular lens; PCIOL posterior chamber intraocular lens; Phaco/IOL phacoemulsification with intraocular lens placement; [place] photorefractive keratectomy; LASIK laser assisted in situ keratomileusis; HTN hypertension; DM diabetes mellitus; COPD chronic obstructive pulmonary disease

## 2021-08-13 ENCOUNTER — Ambulatory Visit (INDEPENDENT_AMBULATORY_CARE_PROVIDER_SITE_OTHER): Payer: Medicare Other | Admitting: Ophthalmology

## 2021-08-13 ENCOUNTER — Encounter (INDEPENDENT_AMBULATORY_CARE_PROVIDER_SITE_OTHER): Payer: Self-pay | Admitting: Ophthalmology

## 2021-08-13 ENCOUNTER — Other Ambulatory Visit: Payer: Self-pay

## 2021-08-13 DIAGNOSIS — H348122 Central retinal vein occlusion, left eye, stable: Secondary | ICD-10-CM

## 2021-08-13 DIAGNOSIS — H2511 Age-related nuclear cataract, right eye: Secondary | ICD-10-CM | POA: Diagnosis not present

## 2021-08-13 NOTE — Progress Notes (Signed)
08/13/2021     CHIEF COMPLAINT Patient presents for  Chief Complaint  Patient presents with   Retina Follow Up      HISTORY OF PRESENT ILLNESS: Lucas Davidson is a 70 y.o. male who presents to the clinic today for:   HPI     Retina Follow Up   Patient presents with  CRVO/BRVO.  In both eyes.  This started 9 months ago.  Duration of 9 months.  Since onset it is stable.        Comments   9 month f/u OU with OCT  Pt is scheduled for surgery with ENT in Jan 2023 to remove a growth behind the ear drum. Pt reports having 1L of fluid removed from an area on his back 06/2021, states benign.      Last edited by Lucas Davidson, COA on 08/13/2021 12:54 PM.      Referring physician: [name], MD [address] [place],  [place] [address]  HISTORICAL INFORMATION:   Selected notes from the MEDICAL RECORD NUMBER       CURRENT MEDICATIONS: No current outpatient medications on file. (Ophthalmic Drugs)   No current facility-administered medications for this visit. (Ophthalmic Drugs)   Current Outpatient Medications (Other)  Medication Sig   aspirin EC 81 MG tablet Take 81 mg by mouth daily.   lisinopril (ZESTRIL) 10 MG tablet Take 10 mg by mouth daily.   metoprolol tartrate (LOPRESSOR) 50 MG tablet Take 50 mg by mouth 2 (two) times daily.   mycophenolate (CELLCEPT) 500 MG tablet Take by mouth 2 (two) times daily.   Vitamin D, Ergocalciferol, (DRISDOL) 1.25 MG (50000 UNIT) CAPS capsule Take 50,000 Units by mouth every 7 (seven) days.   No current facility-administered medications for this visit. (Other)      REVIEW OF SYSTEMS:    ALLERGIES Allergies  Allergen Reactions   Lipitor [Atorvastatin]    Penicillins     PAST MEDICAL HISTORY Past Medical History:  Diagnosis Date   Hypertension    Thyroid disease    Past Surgical History:  Procedure Laterality Date   CATARACT EXTRACTION Left 05/31/2018    FAMILY HISTORY Family History   Problem Relation Age of Onset   Lupus Mother     SOCIAL HISTORY Social History   Tobacco Use   Smoking status: Never   Smokeless tobacco: Never         OPHTHALMIC EXAM:  Base Eye Exam     Visual Acuity (ETDRS)       Right Left   Dist [place] 20/20 20/300   Dist ph [place]  20/250         Tonometry (Tonopen, 12:59 PM)       Right Left   Pressure 12 12         Pupils       Pupils Dark Light Shape React APD   Right PERRL 5 4 Round Brisk None   Left PERRL 5 4 Round Brisk None         Visual Fields (Counting fingers)       Left Right    Full Full         Extraocular Movement       Right Left    Full XT         Neuro/Psych     Oriented x3: Yes   Mood/Affect: Normal         Dilation     Both eyes:  1.0% Mydriacyl, 2.5% Phenylephrine @ 12:59 PM           Slit Lamp and Fundus Exam     External Exam       Right Left   External Normal Normal         Slit Lamp Exam       Right Left   Lids/Lashes Normal Normal   Conjunctiva/Sclera White and quiet White and quiet   Cornea Clear Clear   Anterior Chamber Deep and quiet Deep and quiet   Iris Round and reactive Round and reactive   Lens 2+ Nuclear sclerosis Posterior chamber intraocular lens   Anterior Vitreous Normal Normal         Fundus Exam       Right Left   Posterior Vitreous Posterior vitreous detachment Normal   Disc Normal Shunt Vessels   C/D Ratio 0.2 0.5   Macula Normal Microaneurysms, no macular thickening   Vessels Normal old CRVO , inactive did vessels, sclerotic arteries   Periphery Normal Good PRP            IMAGING AND PROCEDURES  Imaging and Procedures for 08/13/21  OCT, Retina - OU - Both Eyes       Right Eye Quality was good. Scan locations included subfoveal. Central Foveal Thickness: 284. Progression has been stable. Findings include normal observations.   Left Eye Quality was borderline. Scan locations included subfoveal. Central Foveal  Thickness: 265. Progression has been stable. Findings include abnormal foveal contour, outer retinal atrophy, central retinal atrophy, inner retinal atrophy.   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 Central retinal vein occlusion of left eye, unspecified complication status  H34.8122 OCT, Retina - OU - Both Eyes    2. Central retinal vein occlusion, left eye, stable  H34.8122     3. Age-related nuclear cataract of right eye  H25.11       1.  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  OD with moderate NSC, will observe  3.  Ophthalmic Meds Ordered this visit:  No orders of the defined types were placed in this encounter.      Return in about 9 months (around 05/13/2022) for DILATE OU, COLOR FP, OCT.  There are no Patient Instructions on file for this visit.   Explained the diagnoses, plan, and follow up with the patient and they expressed understanding.  Patient expressed understanding of the importance of proper follow up care.   Lucas Davidson M.D. Diseases & Surgery of the Retina and Vitreous [facility] 08/13/21     Abbreviations: M myopia (nearsighted); A astigmatism; H hyperopia (farsighted); P presbyopia; Mrx spectacle prescription;  CTL contact lenses; OD right eye; OS left eye; OU both eyes  XT exotropia; ET esotropia; PEK punctate epithelial keratitis; PEE punctate epithelial erosions; DES dry eye syndrome; MGD meibomian gland dysfunction; ATs artificial tears; PFAT's preservative free artificial tears;  NSC nuclear sclerotic cataract; PSC posterior subcapsular cataract; ERM epi-retinal membrane; PVD posterior vitreous  detachment; RD retinal detachment; DM diabetes mellitus; DR diabetic retinopathy; NPDR non-proliferative diabetic retinopathy; PDR proliferative diabetic retinopathy; CSME clinically significant macular edema; DME diabetic macular edema; dbh dot blot hemorrhages; CWS cotton wool spot; POAG primary open angle glaucoma; C/D cup-to-disc ratio; HVF humphrey visual field; GVF goldmann visual field; OCT optical coherence tomography; IOP intraocular pressure; BRVO Branch retinal vein occlusion; CRVO central retinal vein occlusion; CRAO central retinal artery occlusion; BRAO branch retinal artery occlusion; RT retinal tear; SB scleral buckle; PPV pars plana vitrectomy; VH Vitreous hemorrhage; PRP panretinal laser photocoagulation; IVK intravitreal kenalog; VMT vitreomacular traction; MH Macular hole;  NVD neovascularization of the disc; NVE neovascularization elsewhere; AREDS age related eye disease study; ARMD age related macular degeneration; POAG primary open angle glaucoma; EBMD epithelial/anterior basement membrane dystrophy; ACIOL anterior chamber intraocular lens; IOL intraocular lens; PCIOL posterior chamber intraocular lens; Phaco/IOL phacoemulsification with intraocular lens placement; PRK photorefractive keratectomy; LASIK laser assisted in situ keratomileusis; HTN hypertension; DM diabetes mellitus; COPD chronic obstructive pulmonary disease

## 2021-08-13 NOTE — Assessment & Plan Note (Signed)
Progressive NSC yet stable acuity

## 2021-08-13 NOTE — Assessment & Plan Note (Signed)
OS QUIESCENT,, no active disease, previous ischemia of the macula now atrophy, stable, not need therapy right now

## 2022-05-13 ENCOUNTER — Encounter (INDEPENDENT_AMBULATORY_CARE_PROVIDER_SITE_OTHER): Payer: Medicare Other | Admitting: Ophthalmology

## 2022-06-17 ENCOUNTER — Encounter (INDEPENDENT_AMBULATORY_CARE_PROVIDER_SITE_OTHER): Payer: Medicare Other | Admitting: Ophthalmology

## 2024-06-30 DEATH — deceased
